# Patient Record
Sex: Male | Born: 1950 | Race: White | Hispanic: No | Marital: Married | Smoking: Current every day smoker
Health system: Southern US, Community
[De-identification: ages and names within clinical notes are randomized; demographics above are authoritative.]

## PROBLEM LIST (undated history)

## (undated) DIAGNOSIS — I10 Essential (primary) hypertension: Secondary | ICD-10-CM

## (undated) DIAGNOSIS — I219 Acute myocardial infarction, unspecified: Secondary | ICD-10-CM

## (undated) DIAGNOSIS — E785 Hyperlipidemia, unspecified: Secondary | ICD-10-CM

## (undated) DIAGNOSIS — J449 Chronic obstructive pulmonary disease, unspecified: Secondary | ICD-10-CM

## (undated) HISTORY — PX: VASCULAR SURGERY: SHX849

## (undated) HISTORY — DX: Hyperlipidemia, unspecified: E78.5

## (undated) HISTORY — PX: BACK SURGERY: SHX140

## (undated) HISTORY — DX: Acute myocardial infarction, unspecified: I21.9

## (undated) HISTORY — DX: Essential (primary) hypertension: I10

## (undated) HISTORY — PX: NECK SURGERY: SHX720

## (undated) HISTORY — DX: Chronic obstructive pulmonary disease, unspecified: J44.9

---

## 2012-10-21 ENCOUNTER — Encounter: Payer: Self-pay | Admitting: Critical Care Medicine

## 2012-10-21 ENCOUNTER — Ambulatory Visit (INDEPENDENT_AMBULATORY_CARE_PROVIDER_SITE_OTHER): Payer: 59 | Admitting: Critical Care Medicine

## 2012-10-21 VITALS — BP 134/80 | HR 82 | Temp 98.7°F | Ht 68.0 in | Wt 215.0 lb

## 2012-10-21 DIAGNOSIS — J441 Chronic obstructive pulmonary disease with (acute) exacerbation: Secondary | ICD-10-CM | POA: Insufficient documentation

## 2012-10-21 DIAGNOSIS — I10 Essential (primary) hypertension: Secondary | ICD-10-CM | POA: Insufficient documentation

## 2012-10-21 MED ORDER — TIOTROPIUM BROMIDE MONOHYDRATE 18 MCG IN CAPS: 18.0000 ug | ORAL_CAPSULE | Freq: Every day | RESPIRATORY_TRACT | Status: DC

## 2012-10-21 MED ORDER — PREDNISONE 10 MG PO TABS: ORAL_TABLET | ORAL | Status: DC

## 2012-10-21 MED ORDER — AZITHROMYCIN 250 MG PO TABS: 250.0000 mg | ORAL_TABLET | Freq: Every day | ORAL | Status: DC

## 2012-10-21 MED ORDER — NICOTINE 10 MG IN INHA: RESPIRATORY_TRACT | Status: DC

## 2012-10-21 NOTE — Patient Instructions (Addendum)
Use nicotrol inhaler to quit smoking, 60-80 puff per cartridge 6 cartridge per day Add spiriva one capsule two puff daily Stay on symbicort Prednisone 10mg  Take 4 for two days three for two days two for two days one for two days Azithromycin 250mg  Take two once then one daily until gone Reflux diet Stay on omeprazole Return 1 month for recheck

## 2012-10-21 NOTE — Assessment & Plan Note (Signed)
Chronic obstructive lung disease with asthmatic bronchitic component and now acute flare with primary chronic bronchitis and not evidence of significant emphysema on scans or exam. Patient has ongoing smoking use which is a major issue here. Reflux disease also playing a role in this situation Notes spirometry shows combination of restrictive and obstructive defect Plan Use nicotrol inhaler to quit smoking, 60-80 puff per cartridge 6 cartridge per day Add spiriva one capsule two puff daily Stay on symbicort Prednisone 10mg  Take 4 for two days three for two days two for two days one for two days Azithromycin 250mg  Take two once then one daily until gone Reflux diet Stay on omeprazole Return 1 month for recheck

## 2012-10-21 NOTE — Progress Notes (Signed)
Subjective:    Patient ID: Cody Stone, male    DOB: [birth year], 62 y.o.   MRN: [id number]  HPI Comments: Dx Copd x 2 weeks.  Pt went to ED.  Dyspnea is worse x 2 months Works in Program[employer], applications, paper dust , cutting up floor Smokes now: now on e cigs.  1PPD  Recent ED visit 1 month ago  Shortness of Breath This is a chronic problem. The current episode started 1 to 4 weeks ago. The problem occurs constantly (dyspneic at rest and exertion.). The problem has been gradually worsening. Associated symptoms include orthopnea, PND, rhinorrhea, sputum production and wheezing. Pertinent negatives include no abdominal pain, chest pain, claudication, coryza, ear pain, fever, headaches, hemoptysis, leg pain, leg swelling, neck pain, rash, sore throat, swollen glands, syncope or vomiting. The symptoms are aggravated by any activity, fumes, odors, exercise, lying flat, occupational exposure, smoke, weather changes and URIs (has to slow down. ). Associated symptoms comments: Milky gray mucus. Risk factors include smoking. He has tried beta agonist inhalers, steroid inhalers and oral steroids (albuterol helps, uses several times at night) for the symptoms. The treatment provided moderate relief. His past medical history is significant for CAD and COPD. There is no history of allergies, aspirin allergies, asthma, bronchiolitis, chronic lung disease, DVT, a heart failure, PE, pneumonia or a recent surgery. (Stent in CAD 2010 at [facility])    Past Medical History  Diagnosis Date  . Hypertension   . Hyperlipidemia   . Heart attack     2010, Stents at [facility]  . COPD (chronic obstructive pulmonary disease)      Family History  Problem Relation Age of Onset  . Cancer Mother   . Heart disease Father      History   Social History  . Marital Status: Married    Spouse Name: N/A    Number of Children: N/A  . Years of Education: N/A   Occupational History  . Not on file.   Social History Main Topics  . Smoking  status: Current Every Day Smoker -- 0.50 packs/day for 50 years    Types: Cigarettes  . Smokeless tobacco: Never Used  . Alcohol Use: Yes     Comment: socially, special occasions  . Drug Use: No  . Sexually Active: Not on file   Other Topics Concern  . Not on file   Social History Narrative  . No narrative on file     No Known Allergies   No outpatient prescriptions prior to visit.   No facility-administered medications prior to visit.      Review of Systems  Constitutional: Positive for activity change and fatigue. Negative for fever, chills, diaphoresis, appetite change and unexpected weight change.  HENT: Positive for congestion, rhinorrhea, sneezing, voice change, postnasal drip and sinus pressure. Negative for hearing loss, ear pain, nosebleeds, sore throat, facial swelling, mouth sores, trouble swallowing, neck pain, neck stiffness, dental problem, tinnitus and ear discharge.   Eyes: Negative for photophobia, discharge, itching and visual disturbance.  Respiratory: Positive for cough, sputum production, shortness of breath and wheezing. Negative for apnea, hemoptysis, choking, chest tightness and stridor.   Cardiovascular: Positive for orthopnea and PND. Negative for chest pain, palpitations, claudication, leg swelling and syncope.  Gastrointestinal: Negative for nausea, vomiting, abdominal pain, constipation, blood in stool and abdominal distention.       Notes daily heartburn  Genitourinary: Positive for frequency. Negative for dysuria, urgency, hematuria, flank pain, decreased urine volume and difficulty urinating.  Musculoskeletal:  Positive for myalgias and arthralgias. Negative for back pain, joint swelling and gait problem.  Skin: Negative for color change, pallor and rash.  Neurological: Negative for dizziness, tremors, seizures, syncope, speech difficulty, weakness, light-headedness, numbness and headaches.  Hematological: Negative for adenopathy. Bruises/bleeds  easily.  Psychiatric/Behavioral: Positive for sleep disturbance. Negative for confusion and agitation. The patient is not nervous/anxious.        Objective:   Physical Exam Filed Vitals:   10/21/12 1059  BP: 134/80  Pulse: 82  Temp: 98.7 F (37.1 C)  TempSrc: Oral  Height: 5\' 8"  (1.727 m)  Weight: 215 lb (97.523 kg)  SpO2: 95%    Gen: Pleasant, well-nourished, in no distress,  normal affect  ENT: No lesions,  mouth clear,  oropharynx clear, no postnasal drip  Neck: No JVD, no TMG, no carotid bruits  Lungs: No use of accessory muscles, no dullness to percussion, expired wheezes and poor airflow  Cardiovascular: RRR, heart sounds normal, no murmur or gallops, no peripheral edema  Abdomen: soft and NT, no HSM,  BS normal  Musculoskeletal: No deformities, no cyanosis or clubbing  Neuro: alert, non focal  Skin: Warm, no lesions or rashes   Cleda Daub 10/21/2012: FeV1 55% Fef 25 75 32%  FeV1/FVC 72%   10/2012: CT scan of the chest shows peribronchial thickening but no true emphysema seen and no pulmonary embolism     Assessment & Plan:

## 2012-10-25 ENCOUNTER — Telehealth: Payer: Self-pay | Admitting: Critical Care Medicine

## 2012-10-25 NOTE — Telephone Encounter (Signed)
Spoke with Fannie Knee at Dr. Marcha Solders office. Dr. Sedalia Muta requesting OV Note from patients visit on 10/21/12. This note has been printed and faxed to [phone] Nothing further needed at this time.

## 2012-11-02 ENCOUNTER — Institutional Professional Consult (permissible substitution): Payer: Self-pay | Admitting: Pulmonary Disease

## 2012-11-29 ENCOUNTER — Encounter: Payer: Self-pay | Admitting: Critical Care Medicine

## 2012-11-29 ENCOUNTER — Ambulatory Visit (INDEPENDENT_AMBULATORY_CARE_PROVIDER_SITE_OTHER): Payer: 59 | Admitting: Critical Care Medicine

## 2012-11-29 VITALS — BP 122/72 | HR 86 | Temp 97.7°F | Ht 68.0 in | Wt 210.0 lb

## 2012-11-29 DIAGNOSIS — I255 Ischemic cardiomyopathy: Secondary | ICD-10-CM | POA: Insufficient documentation

## 2012-11-29 DIAGNOSIS — J441 Chronic obstructive pulmonary disease with (acute) exacerbation: Secondary | ICD-10-CM

## 2012-11-29 MED ORDER — DOXYCYCLINE HYCLATE 100 MG PO TBEC: 100.0000 mg | DELAYED_RELEASE_TABLET | Freq: Two times a day (BID) | ORAL | Status: DC

## 2012-11-29 MED ORDER — PREDNISONE 10 MG PO TABS: ORAL_TABLET | ORAL | Status: DC

## 2012-11-29 MED ORDER — TIOTROPIUM BROMIDE MONOHYDRATE 18 MCG IN CAPS: 18.0000 ug | ORAL_CAPSULE | Freq: Every day | RESPIRATORY_TRACT | Status: AC

## 2012-11-29 MED ORDER — ALBUTEROL SULFATE HFA 108 (90 BASE) MCG/ACT IN AERS: 2.0000 | INHALATION_SPRAY | Freq: Four times a day (QID) | RESPIRATORY_TRACT | Status: AC | PRN

## 2012-11-29 NOTE — Assessment & Plan Note (Signed)
Chronic obstructive lung Disease with recurrent exacerbation due to ongoing smoking use  Plan Resume Spiriva ,use samples STay on symbicort Sample of albuterol given STop smoking, use Nicorette Minis lozenges 4mg , use 4-6 x per day as needed Prednisone 10mg  Take 4 for three days 3 for three days 2 for three days 1 for three days and stop Doxycycline 100mg  twice daily  Return 2 months

## 2012-11-29 NOTE — Progress Notes (Signed)
Subjective:    Patient ID: Cody Stone, male    DOB: 1951-01-08, 62 y.o.   MRN: [id number]  HPI 11/29/2012 Not much change from 10/2012.   Now down to 1/2 PPD.  Lost 10#.  Notes more cough, mucus is milky and hard to raise and gags.  On symbicort and spiriva.  Nicotrol too expensive. When on pred /abx this helped the cough some Just ran out of spiriva.    Albuterol has run out   Past Medical History  Diagnosis Date  . Hypertension   . Hyperlipidemia   . Heart attack     2010, Stents at [facility]  . COPD (chronic obstructive pulmonary disease)      Family History  Problem Relation Age of Onset  . Cancer Mother   . Heart disease Father      History   Social History  . Marital Status: Married    Spouse Name: N/A    Number of Children: N/A  . Years of Education: N/A   Occupational History  . Not on file.   Social History Main Topics  . Smoking status: Current Every Day Smoker -- 0.50 packs/day    Types: Cigarettes  . Smokeless tobacco: Never Used     Comment: Started smoking at age 36.  . Alcohol Use: Yes     Comment: socially, special occasions  . Drug Use: No  . Sexually Active: Not on file   Other Topics Concern  . Not on file   Social History Narrative  . No narrative on file     No Known Allergies   Outpatient Prescriptions Prior to Visit  Medication Sig Dispense Refill  . aspirin 81 MG tablet Take 81 mg by mouth daily.      . budesonide-formoterol (SYMBICORT) 160-4.5 MCG/ACT inhaler Inhale 2 puffs into the lungs 2 (two) times daily.      . hydrochlorothiazide (HYDRODIURIL) 25 MG tablet Take 25 mg by mouth daily.      . metoprolol (LOPRESSOR) 50 MG tablet Take 50 mg by mouth 2 (two) times daily.      Marland Kitchen omeprazole (PRILOSEC) 40 MG capsule Take 40 mg by mouth daily.      . simvastatin (ZOCOR) 20 MG tablet Take 20 mg by mouth every evening.      . tiotropium (SPIRIVA HANDIHALER) 18 MCG inhalation capsule Place 1 capsule (18 mcg total) into inhaler and inhale  daily.  30 capsule  6  . albuterol (PROVENTIL HFA;VENTOLIN HFA) 108 (90 BASE) MCG/ACT inhaler Inhale 2 puffs into the lungs every 6 (six) hours as needed for wheezing.      . nicotine (NICOTROL) 10 MG inhaler 60- 80 puff per cartridge, 6 cartridges per day  168 each  2  . azithromycin (ZITHROMAX) 250 MG tablet Take 1 tablet (250 mg total) by mouth daily. Take two once then one daily until gone  6 each  0  . predniSONE (DELTASONE) 10 MG tablet Take 4 for two days three for two days two for two days one for two days  20 tablet  0   No facility-administered medications prior to visit.      Review of Systems  Constitutional: Positive for activity change and fatigue. Negative for chills, diaphoresis, appetite change and unexpected weight change.  HENT: Positive for congestion, sneezing, voice change, postnasal drip and sinus pressure. Negative for hearing loss, nosebleeds, facial swelling, mouth sores, trouble swallowing, neck stiffness, dental problem, tinnitus and ear discharge.   Eyes: Negative for photophobia,  discharge, itching and visual disturbance.  Respiratory: Positive for cough. Negative for apnea, choking, chest tightness and stridor.   Cardiovascular: Negative for palpitations.  Gastrointestinal: Negative for nausea, constipation, blood in stool and abdominal distention.       Notes daily heartburn  Genitourinary: Positive for frequency. Negative for dysuria, urgency, hematuria, flank pain, decreased urine volume and difficulty urinating.  Musculoskeletal: Positive for myalgias and arthralgias. Negative for back pain, joint swelling and gait problem.  Skin: Negative for color change and pallor.  Neurological: Negative for dizziness, tremors, seizures, syncope, speech difficulty, weakness, light-headedness and numbness.  Hematological: Negative for adenopathy. Bruises/bleeds easily.  Psychiatric/Behavioral: Positive for sleep disturbance. Negative for confusion and agitation. The  patient is not nervous/anxious.        Objective:   Physical Exam  Filed Vitals:   11/29/12 1035  BP: 122/72  Pulse: 86  Temp: 97.7 F (36.5 C)  TempSrc: Oral  Height: 5\' 8"  (1.727 m)  Weight: 210 lb (95.255 kg)  SpO2: 93%    Gen: Pleasant, well-nourished, in no distress,  normal affect  ENT: No lesions,  mouth clear,  oropharynx clear, no postnasal drip  Neck: No JVD, no TMG, no carotid bruits  Lungs: No use of accessory muscles, no dullness to percussion, expired wheezes and poor airflow  Cardiovascular: RRR, heart sounds normal, no murmur or gallops, no peripheral edema  Abdomen: soft and NT, no HSM,  BS normal  Musculoskeletal: No deformities, no cyanosis or clubbing  Neuro: alert, non focal  Skin: Warm, no lesions or rashes   Cleda Daub 10/21/2012: FeV1 55% Fef 25 75 32%  FeV1/FVC 72%   10/2012: CT scan of the chest shows peribronchial thickening but no true emphysema seen and no pulmonary embolism     Assessment & Plan:   Obstructive chronic bronchitis with exacerbation Chronic obstructive lung Disease with recurrent exacerbation due to ongoing smoking use  Plan Resume Spiriva ,use samples STay on symbicort Sample of albuterol given STop smoking, use Nicorette Minis lozenges 4mg , use 4-6 x per day as needed Prednisone 10mg  Take 4 for three days 3 for three days 2 for three days 1 for three days and stop Doxycycline 100mg  twice daily  Return 2 months    Updated Medication List Outpatient Encounter Prescriptions as of 11/29/2012  Medication Sig Dispense Refill  . aspirin 81 MG tablet Take 81 mg by mouth daily.      . budesonide-formoterol (SYMBICORT) 160-4.5 MCG/ACT inhaler Inhale 2 puffs into the lungs 2 (two) times daily.      . hydrochlorothiazide (HYDRODIURIL) 25 MG tablet Take 25 mg by mouth daily.      . metoprolol (LOPRESSOR) 50 MG tablet Take 50 mg by mouth 2 (two) times daily.      Marland Kitchen omeprazole (PRILOSEC) 40 MG capsule Take 40 mg by mouth daily.       . simvastatin (ZOCOR) 20 MG tablet Take 20 mg by mouth every evening.      . tiotropium (SPIRIVA HANDIHALER) 18 MCG inhalation capsule Place 1 capsule (18 mcg total) into inhaler and inhale daily.  12 capsule  4  . [DISCONTINUED] tiotropium (SPIRIVA HANDIHALER) 18 MCG inhalation capsule Place 1 capsule (18 mcg total) into inhaler and inhale daily.  30 capsule  6  . albuterol (PROVENTIL HFA;VENTOLIN HFA) 108 (90 BASE) MCG/ACT inhaler Inhale 2 puffs into the lungs every 6 (six) hours as needed for wheezing.  6.7 g  0  . doxycycline (DORYX) 100 MG EC tablet Take 1  tablet (100 mg total) by mouth 2 (two) times daily.  14 tablet  0  . predniSONE (DELTASONE) 10 MG tablet Take 4 for three days 3 for three days 2 for three days 1 for three days and stop  30 tablet  0  . [DISCONTINUED] albuterol (PROVENTIL HFA;VENTOLIN HFA) 108 (90 BASE) MCG/ACT inhaler Inhale 2 puffs into the lungs every 6 (six) hours as needed for wheezing.      . [DISCONTINUED] azithromycin (ZITHROMAX) 250 MG tablet Take 1 tablet (250 mg total) by mouth daily. Take two once then one daily until gone  6 each  0  . [DISCONTINUED] nicotine (NICOTROL) 10 MG inhaler 60- 80 puff per cartridge, 6 cartridges per day  168 each  2  . [DISCONTINUED] predniSONE (DELTASONE) 10 MG tablet Take 4 for two days three for two days two for two days one for two days  20 tablet  0   No facility-administered encounter medications on file as of 11/29/2012.

## 2012-11-29 NOTE — Patient Instructions (Addendum)
Resume Spiriva ,use samples STay on symbicort Sample of albuterol given STop smoking, use Nicorette Minis lozenges 4mg , use 4-6 x per day as needed  To downstairs pharmacy sent: Prednisone 10mg  Take 4 for three days 3 for three days 2 for three days 1 for three days and stop Doxycycline 100mg  twice daily  Return 2 months

## 2013-01-27 ENCOUNTER — Ambulatory Visit: Payer: 59 | Admitting: Critical Care Medicine

## 2017-08-31 ENCOUNTER — Emergency Department (HOSPITAL_COMMUNITY)
Admission: EM | Admit: 2017-08-31 | Discharge: 2017-08-31 | Disposition: A | Discharge status: Home / Self Care | Payer: Non-veteran care | Attending: Emergency Medicine | Admitting: Emergency Medicine

## 2017-08-31 ENCOUNTER — Emergency Department (HOSPITAL_COMMUNITY): Payer: Non-veteran care

## 2017-08-31 ENCOUNTER — Encounter (HOSPITAL_COMMUNITY): Payer: Self-pay

## 2017-08-31 ENCOUNTER — Other Ambulatory Visit: Payer: Self-pay

## 2017-08-31 DIAGNOSIS — Z7982 Long term (current) use of aspirin: Secondary | ICD-10-CM | POA: Insufficient documentation

## 2017-08-31 DIAGNOSIS — R042 Hemoptysis: Secondary | ICD-10-CM | POA: Diagnosis present

## 2017-08-31 DIAGNOSIS — R791 Abnormal coagulation profile: Secondary | ICD-10-CM

## 2017-08-31 DIAGNOSIS — R059 Cough, unspecified: Secondary | ICD-10-CM

## 2017-08-31 DIAGNOSIS — J449 Chronic obstructive pulmonary disease, unspecified: Secondary | ICD-10-CM | POA: Diagnosis not present

## 2017-08-31 DIAGNOSIS — Z7901 Long term (current) use of anticoagulants: Secondary | ICD-10-CM | POA: Diagnosis not present

## 2017-08-31 DIAGNOSIS — I2510 Atherosclerotic heart disease of native coronary artery without angina pectoris: Secondary | ICD-10-CM | POA: Insufficient documentation

## 2017-08-31 DIAGNOSIS — F17210 Nicotine dependence, cigarettes, uncomplicated: Secondary | ICD-10-CM | POA: Insufficient documentation

## 2017-08-31 DIAGNOSIS — R05 Cough: Secondary | ICD-10-CM

## 2017-08-31 DIAGNOSIS — Z79899 Other long term (current) drug therapy: Secondary | ICD-10-CM | POA: Diagnosis not present

## 2017-08-31 DIAGNOSIS — I10 Essential (primary) hypertension: Secondary | ICD-10-CM | POA: Diagnosis not present

## 2017-08-31 DIAGNOSIS — Z955 Presence of coronary angioplasty implant and graft: Secondary | ICD-10-CM | POA: Insufficient documentation

## 2017-08-31 LAB — CBC WITH DIFFERENTIAL/PLATELET
BASOS PCT: 0 %
Basophils Absolute: 0 10*3/uL (ref 0.0–0.1)
EOS ABS: 0.2 10*3/uL (ref 0.0–0.7)
Eosinophils Relative: 2 %
HCT: 48.3 % (ref 39.0–52.0)
HEMOGLOBIN: 15.5 g/dL (ref 13.0–17.0)
Lymphocytes Relative: 16 %
Lymphs Abs: 1.4 10*3/uL (ref 0.7–4.0)
MCH: 28.7 pg (ref 26.0–34.0)
MCHC: 32.1 g/dL (ref 30.0–36.0)
MCV: 89.3 fL (ref 78.0–100.0)
MONOS PCT: 8 %
Monocytes Absolute: 0.7 10*3/uL (ref 0.1–1.0)
NEUTROS PCT: 74 %
Neutro Abs: 6.5 10*3/uL (ref 1.7–7.7)
Platelets: 170 10*3/uL (ref 150–400)
RBC: 5.41 MIL/uL (ref 4.22–5.81)
RDW: 15.4 % (ref 11.5–15.5)
WBC: 8.7 10*3/uL (ref 4.0–10.5)

## 2017-08-31 LAB — BASIC METABOLIC PANEL
Anion gap: 8 (ref 5–15)
BUN: 12 mg/dL (ref 6–20)
CALCIUM: 9 mg/dL (ref 8.9–10.3)
CHLORIDE: 99 mmol/L — AB (ref 101–111)
CO2: 31 mmol/L (ref 22–32)
CREATININE: 0.71 mg/dL (ref 0.61–1.24)
Glucose, Bld: 176 mg/dL — ABNORMAL HIGH (ref 65–99)
Potassium: 4.5 mmol/L (ref 3.5–5.1)
SODIUM: 138 mmol/L (ref 135–145)

## 2017-08-31 LAB — PROTIME-INR
INR: 4.74 — AB
PROTHROMBIN TIME: 44.2 s — AB (ref 11.4–15.2)

## 2017-08-31 LAB — CBG MONITORING, ED: GLUCOSE-CAPILLARY: 189 mg/dL — AB (ref 65–99)

## 2017-08-31 MED ORDER — SODIUM CHLORIDE 0.9 % IV SOLN
INTRAVENOUS | Status: DC
  Administered 2017-08-31: 20 mL/h via INTRAVENOUS

## 2017-08-31 NOTE — ED Notes (Signed)
Bed: WA09 Expected date: 08/31/17 Expected time: 9:02 AM Means of arrival: Ambulance Comments: EMS- 66yo M, coughing up blood

## 2017-08-31 NOTE — ED Triage Notes (Signed)
EMS reports from home, congestion and cough x 2 weeks, coughing up bright red blood since last night, productive cough with green mucus, and generalized weakness, taking coumadin. Hx of diabetes, vascular surgery lower left leg  BP 150/61 HR 90 resp 18 sp02 97 RA CBG 271

## 2017-08-31 NOTE — ED Notes (Addendum)
Date and time results received: 08/31/17 11:20 AM   Test: INR Critical Value: 4.74  Name of Provider Notified: Freida BusmanAllen MD  Orders Received? Or Actions Taken?: Awaiting further orders

## 2017-08-31 NOTE — ED Provider Notes (Signed)
[facility]-EMERGENCY DEPT Provider Note   CSN: [id number] Arrival date & time: 08/31/17  4098     History   Chief Complaint Chief Complaint  Patient presents with  . Hemoptysis    HPI Cody Stone is a 67 y.o. male.  67 year old male presents after having hemoptysis today x1.  Patient states that he had a cough that was blood-tinged.  He does take Coumadin.  He does also have a history of COPD.  Denies any new dyspnea.  Has chronic cough denies any fever or chills.  No anginal or CHF type symptoms.  Denies any black or bloody stools.  No bruising to his skin.  No recent changes to his Coumadin dosing or new medications.  Symptoms have resolved.      Past Medical History:  Diagnosis Date  . COPD (chronic obstructive pulmonary disease) (HCC)   . Heart attack (HCC)    2010, Stents at [facility]  . Hyperlipidemia   . Hypertension     Patient Active Problem List   Diagnosis Date Noted  . Cardiomyopathy, ischemic 11/29/2012  . Obstructive chronic bronchitis with exacerbation (HCC) 10/21/2012  . Hypertension   . Hyperlipidemia   . CAD S/P percutaneous coronary angioplasty     Past Surgical History:  Procedure Laterality Date  . BACK SURGERY    . NECK SURGERY    . VASCULAR SURGERY         Home Medications    Prior to Admission medications   Medication Sig Start Date End Date Taking? Authorizing Provider  albuterol (PROVENTIL HFA;VENTOLIN HFA) 108 (90 BASE) MCG/ACT inhaler Inhale 2 puffs into the lungs every 6 (six) hours as needed for wheezing. 11/29/12   Storm Frisk, MD  aspirin 81 MG tablet Take 81 mg by mouth daily.    [provider]  budesonide-formoterol (SYMBICORT) 160-4.5 MCG/ACT inhaler Inhale 2 puffs into the lungs 2 (two) times daily.    [provider]  doxycycline (DORYX) 100 MG EC tablet Take 1 tablet (100 mg total) by mouth 2 (two) times daily. 11/29/12   Storm Frisk, MD  hydrochlorothiazide (HYDRODIURIL) 25  MG tablet Take 25 mg by mouth daily.    [provider]  metoprolol (LOPRESSOR) 50 MG tablet Take 50 mg by mouth 2 (two) times daily.    [provider]  omeprazole (PRILOSEC) 40 MG capsule Take 40 mg by mouth daily.    [provider]  predniSONE (DELTASONE) 10 MG tablet Take 4 for three days 3 for three days 2 for three days 1 for three days and stop 11/29/12   Storm Frisk, MD  simvastatin (ZOCOR) 20 MG tablet Take 20 mg by mouth every evening.    [provider]  tiotropium (SPIRIVA HANDIHALER) 18 MCG inhalation capsule Place 1 capsule (18 mcg total) into inhaler and inhale daily. 11/29/12   Storm Frisk, MD    Family History Family History  Problem Relation Age of Onset  . Cancer Mother   . Heart disease Father     Social History Social History   Tobacco Use  . Smoking status: Current Every Day Smoker    Packs/day: 0.50    Types: Cigarettes  . Smokeless tobacco: Never Used  . Tobacco comment: Started smoking at age 67.  Substance Use Topics  . Alcohol use: Yes    Comment: socially, special occasions  . Drug use: No     Allergies   Patient has no known allergies.  Review of Systems Review of Systems  All other systems reviewed and are negative.    Physical Exam Updated Vital Signs BP (!) 159/83 (BP Location: Right Arm)   Pulse 88   Temp 97.9 F (36.6 C) (Oral)   Resp 18   Ht 1.727 m (5\' 8" )   Wt 96.6 kg (213 lb)   SpO2 96%   BMI 32.39 kg/m   Physical Exam  Constitutional: He is oriented to person, place, and time. He appears well-developed and well-nourished.  Non-toxic appearance. No distress.  HENT:  Head: Normocephalic and atraumatic.  Eyes: Conjunctivae, EOM and lids are normal. Pupils are equal, round, and reactive to light.  Neck: Normal range of motion. Neck supple. No tracheal deviation present. No thyroid mass present.  Cardiovascular: Normal rate, regular rhythm and normal heart sounds. Exam reveals  no gallop.  No murmur heard. Pulmonary/Chest: Effort normal and breath sounds normal. No stridor. No respiratory distress. He has no decreased breath sounds. He has no wheezes. He has no rhonchi. He has no rales.  Abdominal: Soft. Normal appearance and bowel sounds are normal. He exhibits no distension. There is no tenderness. There is no rebound and no CVA tenderness.  Musculoskeletal: Normal range of motion. He exhibits no edema or tenderness.  Neurological: He is alert and oriented to person, place, and time. He has normal strength. No cranial nerve deficit or sensory deficit. GCS eye subscore is 4. GCS verbal subscore is 5. GCS motor subscore is 6.  Skin: Skin is warm and dry. No abrasion and no rash noted.  Psychiatric: He has a normal mood and affect. His speech is normal and behavior is normal.  Nursing note and vitals reviewed.    ED Treatments / Results  Labs (all labs ordered are listed, but only abnormal results are displayed) Labs Reviewed  CBG MONITORING, ED - Abnormal; Notable for the following components:      Result Value   Glucose-Capillary 189 (*)    All other components within normal limits  CBC WITH DIFFERENTIAL/PLATELET  BASIC METABOLIC PANEL  PROTIME-INR    EKG  EKG Interpretation None       Radiology No results found.  Procedures Procedures (including critical care time)  Medications Ordered in ED Medications  0.9 %  sodium chloride infusion (not administered)     Initial Impression / Assessment and Plan / ED Course  I have reviewed the triage vital signs and the nursing notes.  Pertinent labs & imaging results that were available during my care of the patient were reviewed by me and considered in my medical decision making (see chart for details).     Patient INR 4.74.  Was told to hold his Coumadin for 2 doses and follow-up with his doctor.  Patient has no signs of hypoxemia.  Has stable hemoglobin negative chest x-ray  Final Clinical  Impressions(s) / ED Diagnoses   Final diagnoses:  Cough    ED Discharge Orders    None       Lorre NickAllen, Ladarius Seubert, MD 08/31/17 1141

## 2017-08-31 NOTE — Discharge Instructions (Signed)
Do not take your Coumadin for 2 days.  Return here for any bleeding and follow-up with your doctor later this week for repeat INR

## 2018-07-05 ENCOUNTER — Ambulatory Visit (HOSPITAL_COMMUNITY): Payer: No Typology Code available for payment source | Admitting: Licensed Clinical Social Worker

## 2018-08-13 ENCOUNTER — Ambulatory Visit (HOSPITAL_COMMUNITY): Payer: No Typology Code available for payment source | Admitting: Psychiatry

## 2019-10-01 ENCOUNTER — Emergency Department (HOSPITAL_COMMUNITY)
Admission: EM | Admit: 2019-10-01 | Discharge: 2019-10-01 | Disposition: A | Discharge status: Home / Self Care | Payer: No Typology Code available for payment source | Attending: Emergency Medicine | Admitting: Emergency Medicine

## 2019-10-01 ENCOUNTER — Other Ambulatory Visit: Payer: Self-pay

## 2019-10-01 ENCOUNTER — Encounter (HOSPITAL_COMMUNITY): Payer: Self-pay | Admitting: *Deleted

## 2019-10-01 ENCOUNTER — Emergency Department (HOSPITAL_COMMUNITY): Payer: No Typology Code available for payment source

## 2019-10-01 DIAGNOSIS — Z794 Long term (current) use of insulin: Secondary | ICD-10-CM | POA: Diagnosis not present

## 2019-10-01 DIAGNOSIS — Z79899 Other long term (current) drug therapy: Secondary | ICD-10-CM | POA: Diagnosis not present

## 2019-10-01 DIAGNOSIS — F17210 Nicotine dependence, cigarettes, uncomplicated: Secondary | ICD-10-CM | POA: Diagnosis not present

## 2019-10-01 DIAGNOSIS — I2510 Atherosclerotic heart disease of native coronary artery without angina pectoris: Secondary | ICD-10-CM | POA: Insufficient documentation

## 2019-10-01 DIAGNOSIS — Z7982 Long term (current) use of aspirin: Secondary | ICD-10-CM | POA: Insufficient documentation

## 2019-10-01 DIAGNOSIS — Z20822 Contact with and (suspected) exposure to covid-19: Secondary | ICD-10-CM | POA: Insufficient documentation

## 2019-10-01 DIAGNOSIS — J441 Chronic obstructive pulmonary disease with (acute) exacerbation: Secondary | ICD-10-CM | POA: Diagnosis not present

## 2019-10-01 DIAGNOSIS — R0602 Shortness of breath: Secondary | ICD-10-CM | POA: Diagnosis present

## 2019-10-01 DIAGNOSIS — I10 Essential (primary) hypertension: Secondary | ICD-10-CM | POA: Insufficient documentation

## 2019-10-01 LAB — CBC
HCT: 53 % — ABNORMAL HIGH (ref 39.0–52.0)
Hemoglobin: 16.6 g/dL (ref 13.0–17.0)
MCH: 29 pg (ref 26.0–34.0)
MCHC: 31.3 g/dL (ref 30.0–36.0)
MCV: 92.5 fL (ref 80.0–100.0)
Platelets: 208 10*3/uL (ref 150–400)
RBC: 5.73 MIL/uL (ref 4.22–5.81)
RDW: 13.9 % (ref 11.5–15.5)
WBC: 11.5 10*3/uL — ABNORMAL HIGH (ref 4.0–10.5)
nRBC: 0 % (ref 0.0–0.2)

## 2019-10-01 LAB — BASIC METABOLIC PANEL
Anion gap: 10 (ref 5–15)
BUN: 9 mg/dL (ref 8–23)
CO2: 28 mmol/L (ref 22–32)
Calcium: 9.1 mg/dL (ref 8.9–10.3)
Chloride: 101 mmol/L (ref 98–111)
Creatinine, Ser: 0.8 mg/dL (ref 0.61–1.24)
GFR calc Af Amer: >60 mL/min (ref >60)
GFR calc non Af Amer: >60 mL/min (ref >60)
Glucose, Bld: 174 mg/dL — ABNORMAL HIGH (ref 70–99)
Potassium: 4.6 mmol/L (ref 3.5–5.1)
Sodium: 139 mmol/L (ref 135–145)

## 2019-10-01 LAB — TROPONIN I (HIGH SENSITIVITY): Troponin I (High Sensitivity): 12 ng/L (ref <18)

## 2019-10-01 MED ORDER — PREDNISONE 50 MG PO TABS: 50.0000 mg | ORAL_TABLET | Freq: Every day | ORAL | 0 refills | Status: AC

## 2019-10-01 MED ORDER — METHYLPREDNISOLONE SODIUM SUCC 125 MG IJ SOLR
125.0000 mg | Freq: Once | INTRAMUSCULAR | Status: AC
  Administered 2019-10-01: 17:00:00 125 mg via INTRAVENOUS
  Filled 2019-10-01: qty 2

## 2019-10-01 MED ORDER — SODIUM CHLORIDE 0.9% FLUSH: 3.0000 mL | Freq: Once | INTRAVENOUS | Status: DC

## 2019-10-01 MED ORDER — DOXYCYCLINE HYCLATE 100 MG PO CAPS: 100.0000 mg | ORAL_CAPSULE | Freq: Two times a day (BID) | ORAL | 0 refills | Status: DC

## 2019-10-01 MED ORDER — PREDNISONE 50 MG PO TABS: 50.0000 mg | ORAL_TABLET | Freq: Every day | ORAL | 0 refills | Status: DC

## 2019-10-01 MED ORDER — ALBUTEROL SULFATE HFA 108 (90 BASE) MCG/ACT IN AERS
6.0000 | INHALATION_SPRAY | Freq: Once | RESPIRATORY_TRACT | Status: AC
  Administered 2019-10-01: 17:00:00 6 via RESPIRATORY_TRACT
  Filled 2019-10-01: qty 6.7

## 2019-10-01 MED ORDER — DOXYCYCLINE HYCLATE 100 MG PO CAPS: 100.0000 mg | ORAL_CAPSULE | Freq: Two times a day (BID) | ORAL | 0 refills | Status: AC

## 2019-10-01 NOTE — ED Notes (Signed)
Patient transported to X-ray 

## 2019-10-01 NOTE — ED Notes (Signed)
Pt ambulated around the room. O2 remained at 95%. Pt stated no shortness of breath while ambulating. Pt is now resting in bed.

## 2019-10-01 NOTE — Discharge Instructions (Signed)
Please take a steroid as directed.  You should take the antibiotic as directed.    You were tested for the coronavirus today.  The results will be available in the next 1 to 2 days.  If the results are positive the hospital will contact you and let you know.  If they are negative you will not be contacted.  Please remain in quarantine until you have the results.  If the results are positive,, You should be isolated for at least 7 days since the onset of your symptoms AND >72 hours after symptoms resolution (absence of fever without the use of fever reducing medicaiton and improvement in respiratory symptoms), whichever is longer   Please call your doctor to schedule an appointment for reevaluation next 5 to 7 days.  Monitor your symptoms closely.  If you have any new or worsening symptoms and please return the emergency department immediately.

## 2019-10-01 NOTE — ED Provider Notes (Signed)
[facility] EMERGENCY DEPARTMENT Provider Note   CSN: [id number] Arrival date & time: 10/01/19  1505     History Chief Complaint  Patient presents with  . Shortness of Breath    Cody Stone is a 69 y.o. male.  HPI   Pt is a 69 y/o male with a h/o COPD (2L at night) , HLD, HTN, CVA, who presents to the ED today for eval of shortness of breath that has been ongoing for the last 2 weeks.  He reports an associated productive cough. He denies chest pain, increased wheezing, or pain with inspiration. He reports associated fatigue and generalized weakness for the same time period. Reports increased satiety for the last 2 weeks and diarrhea this AM. Denies constipation, abd pain, fevers, or urinary sxs. Denies BLE swelling/calf pain, hemoptysis, recent surgery/trauma, recent long travel, hormone use, personal hx of cancer, or hx of DVT/PE.   States he was seen at the [place] yesterday. States he had blood work completed and had two covid tests that were negative. States they did not do an xray.  Past Medical History:  Diagnosis Date  . COPD (chronic obstructive pulmonary disease) (HCC)   . Heart attack (HCC)    2010, Stents at [facility]  . Hyperlipidemia   . Hypertension     Patient Active Problem List   Diagnosis Date Noted  . Cardiomyopathy, ischemic 11/29/2012  . Obstructive chronic bronchitis with exacerbation (HCC) 10/21/2012  . Hypertension   . Hyperlipidemia   . CAD S/P percutaneous coronary angioplasty     Past Surgical History:  Procedure Laterality Date  . BACK SURGERY    . NECK SURGERY    . VASCULAR SURGERY         Family History  Problem Relation Age of Onset  . Cancer Mother   . Heart disease Father     Social History   Tobacco Use  . Smoking status: Current Every Day Smoker    Packs/day: 0.50    Types: Cigarettes  . Smokeless tobacco: Never Used  . Tobacco comment: Started smoking at age 72.  Substance Use Topics  . Alcohol use: Yes   Comment: socially, special occasions  . Drug use: No    Home Medications Prior to Admission medications   Medication Sig Start Date End Date Taking? Authorizing Provider  albuterol (PROVENTIL HFA;VENTOLIN HFA) 108 (90 BASE) MCG/ACT inhaler Inhale 2 puffs into the lungs every 6 (six) hours as needed for wheezing. 11/29/12  Yes Storm Frisk, MD  aspirin 81 MG chewable tablet Chew 81 mg by mouth daily. 03/02/17  Yes [provider]  atorvastatin (LIPITOR) 20 MG tablet Take 20 mg by mouth daily.   Yes [provider]  budesonide-formoterol (SYMBICORT) 160-4.5 MCG/ACT inhaler Inhale 2 puffs into the lungs 2 (two) times daily.   Yes [provider]  empagliflozin (JARDIANCE) 25 MG TABS tablet Take 12.5 mg by mouth daily.   Yes [provider]  EPINEPHrine 0.3 mg/0.3 mL IJ SOAJ injection Inject 0.3 mLs into the muscle as needed. 03/01/17  Yes [provider]  gabapentin (NEURONTIN) 300 MG capsule Take 300 mg by mouth at bedtime.   Yes [provider]  insulin glargine (LANTUS) 100 UNIT/ML injection Inject 30 Units into the skin every morning.   Yes [provider]  metFORMIN (GLUCOPHAGE) 500 MG tablet Take 500 mg by mouth 2 (two) times daily with a meal.   Yes [provider]  Omeprazole 20 MG TBEC Take  20 mg by mouth daily.    Yes [provider]  tamsulosin (FLOMAX) 0.4 MG CAPS capsule Take 0.8 mg by mouth daily after supper.   Yes [provider]  tiotropium (SPIRIVA HANDIHALER) 18 MCG inhalation capsule Place 1 capsule (18 mcg total) into inhaler and inhale daily. 11/29/12  Yes Elsie Stain, MD  Vitamin D, Ergocalciferol, (DRISDOL) 50000 units CAPS capsule Take 50,000 Units by mouth every 7 (seven) days.   Yes [provider]  doxycycline (VIBRAMYCIN) 100 MG capsule Take 1 capsule (100 mg total) by mouth 2 (two) times daily for 7 days. 10/01/19 10/08/19  Brianca Fortenberry S, PA-C  predniSONE  (DELTASONE) 50 MG tablet Take 1 tablet (50 mg total) by mouth daily for 5 days. 10/01/19 10/06/19  Dominiq Fontaine S, PA-C    Allergies    Bee venom  Review of Systems   Review of Systems  Constitutional: Positive for appetite change and fatigue. Negative for fever.  HENT: Negative for ear pain and sore throat.   Eyes: Negative for visual disturbance.  Respiratory: Positive for cough and shortness of breath.   Cardiovascular: Negative for chest pain.  Gastrointestinal: Positive for diarrhea. Negative for abdominal pain, blood in stool, constipation, nausea and vomiting.  Genitourinary: Negative for dysuria, hematuria and urgency.  Musculoskeletal: Negative for back pain.  Skin: Negative for color change and rash.  Neurological: Negative for headaches.  All other systems reviewed and are negative.   Physical Exam Updated Vital Signs BP 130/68   Pulse 94   Temp 98.6 F (37 C) (Oral)   Resp 17   Ht 5\' 8"  (1.727 m)   Wt 95.3 kg   SpO2 94%   BMI 31.93 kg/m   Physical Exam Vitals and nursing note reviewed.  Constitutional:      Appearance: He is well-developed.  HENT:     Head: Normocephalic and atraumatic.  Eyes:     Conjunctiva/sclera: Conjunctivae normal.  Cardiovascular:     Rate and Rhythm: Normal rate and regular rhythm.     Heart sounds: Normal heart sounds. No murmur.  Pulmonary:     Effort: Pulmonary effort is normal. No tachypnea or respiratory distress.     Breath sounds: Examination of the right-upper field reveals wheezing. Examination of the left-upper field reveals wheezing. Examination of the right-middle field reveals wheezing. Examination of the left-middle field reveals wheezing. Examination of the right-lower field reveals wheezing. Examination of the left-lower field reveals wheezing. Wheezing present.     Comments: Speaking in full sentences Abdominal:     General: Bowel sounds are normal.     Palpations: Abdomen is soft.     Tenderness: There is no  abdominal tenderness. There is no guarding or rebound.  Musculoskeletal:     Cervical back: Neck supple.     Right lower leg: No tenderness. No edema.     Left lower leg: No tenderness. No edema.  Skin:    General: Skin is warm and dry.  Neurological:     Mental Status: He is alert.     ED Results / Procedures / Treatments   Labs (all labs ordered are listed, but only abnormal results are displayed) Labs Reviewed  BASIC METABOLIC PANEL - Abnormal; Notable for the following components:      Result Value   Glucose, Bld 174 (*)    All other components within normal limits  CBC - Abnormal; Notable for the following components:   WBC 11.5 (*)    HCT  53.0 (*)    All other components within normal limits  NOVEL CORONAVIRUS, NAA (HOSP ORDER, SEND-OUT TO REF LAB; TAT 18-24 HRS)  TROPONIN I (HIGH SENSITIVITY)    EKG EKG Interpretation  Date/Time:  Saturday October 01 2019 15:30:56 EST Ventricular Rate:  96 PR Interval:  178 QRS Duration: 114 QT Interval:  362 QTC Calculation: 457 R Axis:   -75 Text Interpretation: Normal sinus rhythm Left axis deviation Incomplete right bundle branch block Minimal voltage criteria for LVH, may be normal variant ( Cornell product ) Abnormal ECG No old tracing to compare Confirmed by Rolan Bucco [phone]) on 10/01/2019 6:00:47 PM   Radiology DG Chest 2 View  Result Date: 10/01/2019 CLINICAL DATA:  69 year old male with shortness of breath. EXAM: CHEST - 2 VIEW COMPARISON:  Chest radiograph dated 09/01/2017. FINDINGS: There is no focal consolidation, pleural effusion or pneumothorax. The cardiac silhouette is within normal limits. Atherosclerotic calcification of the aorta. Degenerative changes of the spine. No acute osseous pathology. Lower cervical ACDF. IMPRESSION: No active cardiopulmonary disease. Electronically Signed   By: Elgie Collard M.D.   On: 10/01/2019 16:03    Procedures Procedures (including critical care time)  Medications  Ordered in ED Medications  sodium chloride flush (NS) 0.9 % injection 3 mL (3 mLs Intravenous Not Given 10/01/19 1550)  albuterol (VENTOLIN HFA) 108 (90 Base) MCG/ACT inhaler 6 puff (6 puffs Inhalation Given 10/01/19 1656)  methylPREDNISolone sodium succinate (SOLU-MEDROL) 125 mg/2 mL injection 125 mg (125 mg Intravenous Given 10/01/19 1656)    ED Course  I have reviewed the triage vital signs and the nursing notes.  Pertinent labs & imaging results that were available during my care of the patient were reviewed by me and considered in my medical decision making (see chart for details).    MDM Rules/Calculators/A&P                      69 year old male history of COPD presenting for evaluation of shortness of breath and cough for the last 2 weeks.  Afebrile at home.  No chest pain, pleuritic pain or risk factors for VTE.  Vital signs are within normal limits, satting well on room air.  Does have some significant wheezing on exam.  Will obtain labs, chest x-ray, EKG.  We will retest patient for Covid.  CBC shows a mild leukocytosis at 11,000, otherwise reassuring BMP reassuring Troponin negative  EKG Normal sinus rhythm Left axis deviation Incomplete right bundle branch block Minimal voltage criteria for LVH, may be normal variant ( Cornell product ) Abnormal ECG No old tracing to compare   Chest x-ray was personally reviewed and I agree with the radiologist that there is no focal consolidation, pneumothorax, widened mediastinum, cardiomegaly or other acute pathology at this time.  Patient was given an albuterol inhaler and Solu-Medrol.  He was ambulated about the room and maintain sats at 95% and above and did not experience any dyspnea or tachypnea during this.  On reassessment he states that his breathing feels back to baseline.  He no longer feels short of breath.  I suspect his symptoms are likely secondary to his COPD exacerbation.  He does not have any hypoxia or any indication that he  will require admission at this time.  Would also still consider Covid in this patient and Covid test is currently pending at the time of discharge.  Low suspicion for other cause such as PE.  Have advised that he follow-up with his PCP  and return to the ED for new or worsening symptoms.  He voices understanding of plan and reasons to return.  All questions answered.  Patient able for discharge.  ---  Cody Stone was evaluated in Emergency Department on 10/01/2019 for the symptoms described in the history of present illness. He was evaluated in the context of the global COVID-19 pandemic, which necessitated consideration that the patient might be at risk for infection with the SARS-CoV-2 virus that causes COVID-19. Institutional protocols and algorithms that pertain to the evaluation of patients at risk for COVID-19 are in a state of rapid change based on information released by regulatory bodies including the CDC and federal and state organizations. These policies and algorithms were followed during the patient's care in the ED.  Final Clinical Impression(s) / ED Diagnoses Final diagnoses:  COPD exacerbation (HCC)    Rx / DC Orders ED Discharge Orders         Ordered    predniSONE (DELTASONE) 50 MG tablet  Daily     10/01/19 1809    doxycycline (VIBRAMYCIN) 100 MG capsule  2 times daily     10/01/19 [address] Aleka Twitty S, PA-C 10/01/19 1812    Rolan Bucco, MD 10/01/19 2002

## 2019-10-01 NOTE — ED Triage Notes (Signed)
The pt is c/o sob and weakness for one month  No pain anywhere.  He was just worked up at  [facility] [employer] yesterday   He was unhappy  Because he says they did not do anything

## 2019-10-04 LAB — NOVEL CORONAVIRUS, NAA (HOSP ORDER, SEND-OUT TO REF LAB; TAT 18-24 HRS): SARS-CoV-2, NAA: NOT DETECTED

## 2021-06-27 IMAGING — CR DG CHEST 2V
2 series · 2 of 2 positions shown · non-contrast
Comparison: Chest radiograph dated 09/01/2017.

CLINICAL DATA: 68-year-old male with shortness of breath.

EXAM:
CHEST - 2 VIEW

[chest pa]
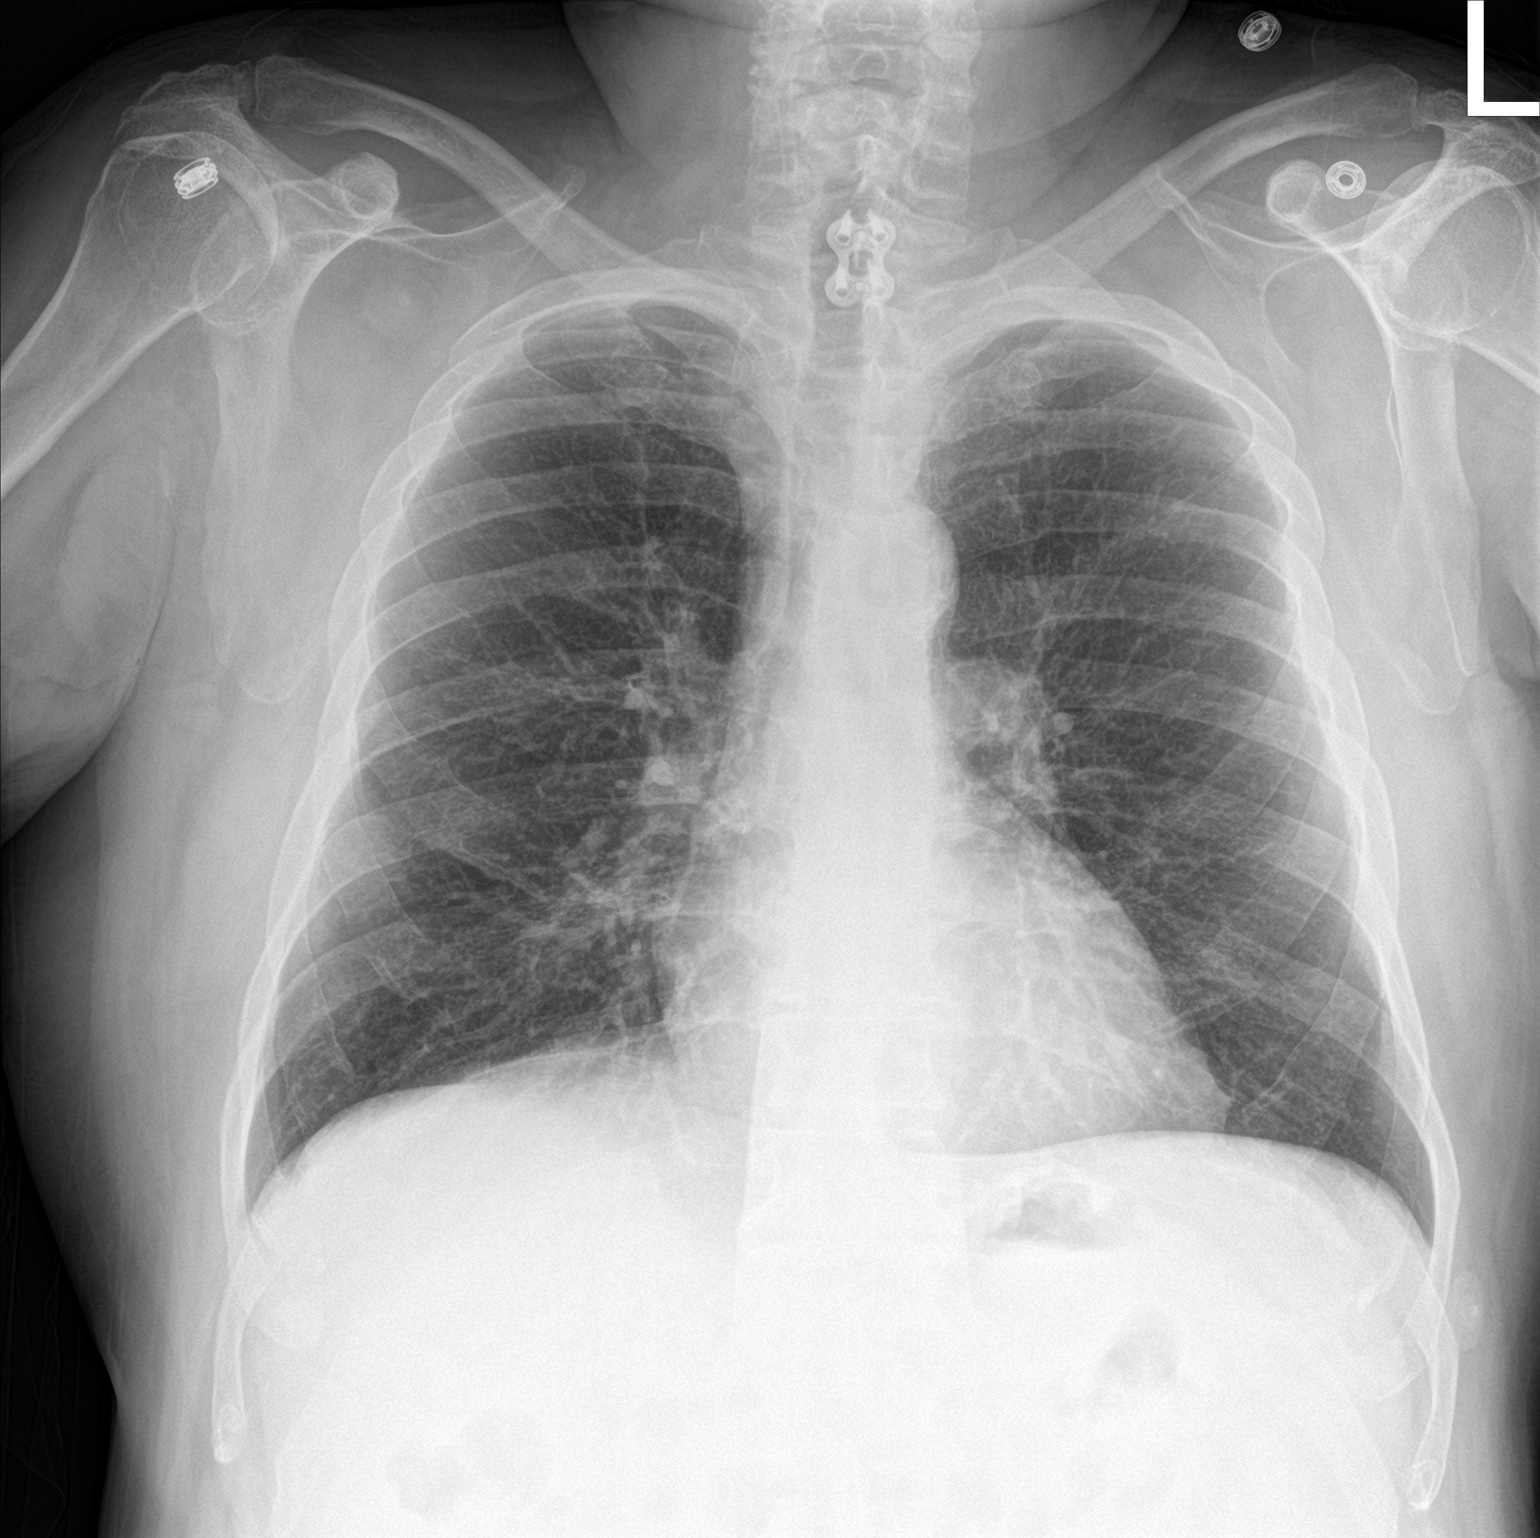

[chest lat]
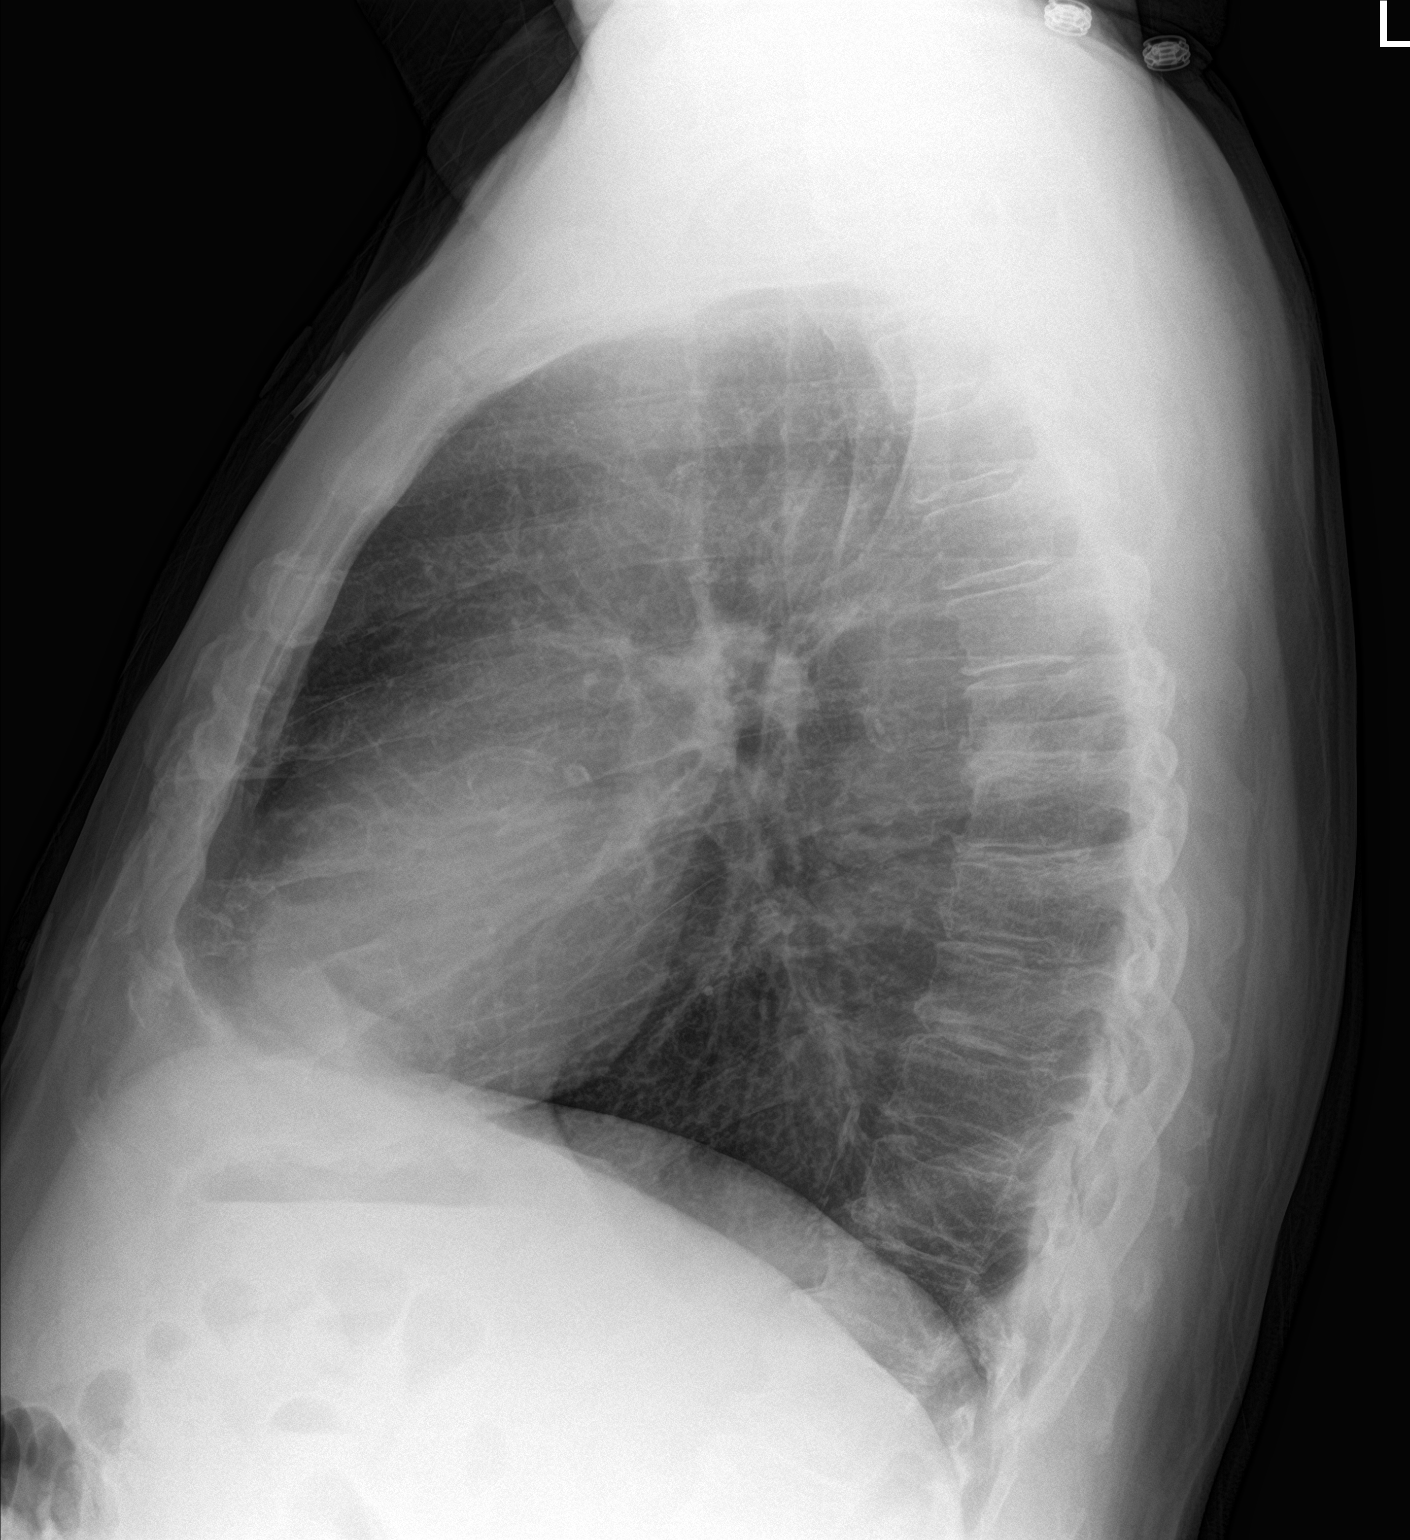

[2 of 2 positions shown; findings below may reference images not displayed]

FINDINGS: There is no focal consolidation, pleural effusion or pneumothorax.
The cardiac silhouette is within normal limits. Atherosclerotic
calcification of the aorta. Degenerative changes of the spine. No
acute osseous pathology. Lower cervical ACDF.
IMPRESSION: No active cardiopulmonary disease.
